# Patient Record
Sex: Male | Born: 1990 | Race: White | Hispanic: No | Marital: Single | Smoking: Never smoker
Health system: Southern US, Community
[De-identification: ages and names within clinical notes are randomized; demographics above are authoritative.]

## PROBLEM LIST (undated history)

## (undated) ENCOUNTER — Emergency Department (HOSPITAL_COMMUNITY): Admission: EM | Disposition: A | Discharge status: Home / Self Care | Payer: 59

---
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
## 2011-02-24 ENCOUNTER — Emergency Department (HOSPITAL_BASED_OUTPATIENT_CLINIC_OR_DEPARTMENT_OTHER)
Admission: EM | Admit: 2011-02-24 | Discharge: 2011-02-25 | Disposition: A | Discharge status: Home / Self Care | Payer: BC Managed Care – PPO | Attending: Emergency Medicine | Admitting: Emergency Medicine

## 2011-02-24 DIAGNOSIS — R110 Nausea: Secondary | ICD-10-CM | POA: Insufficient documentation

## 2011-02-24 DIAGNOSIS — R1031 Right lower quadrant pain: Secondary | ICD-10-CM | POA: Insufficient documentation

## 2011-02-25 ENCOUNTER — Emergency Department (INDEPENDENT_AMBULATORY_CARE_PROVIDER_SITE_OTHER): Payer: BC Managed Care – PPO

## 2011-02-25 DIAGNOSIS — R1031 Right lower quadrant pain: Secondary | ICD-10-CM

## 2011-02-25 LAB — CBC
HCT: 41.4 % (ref 39.0–52.0)
Hemoglobin: 14.4 g/dL (ref 13.0–17.0)
MCH: 28.9 pg (ref 26.0–34.0)
MCHC: 34.8 g/dL (ref 30.0–36.0)
MCV: 83 fL (ref 78.0–100.0)

## 2011-02-25 LAB — COMPREHENSIVE METABOLIC PANEL
CO2: 27 mEq/L (ref 19–32)
Calcium: 10.4 mg/dL (ref 8.4–10.5)
Creatinine, Ser: 0.8 mg/dL (ref 0.4–1.5)
GFR calc non Af Amer: >60 mL/min (ref >60)
Glucose, Bld: 109 mg/dL — ABNORMAL HIGH (ref 70–99)

## 2011-02-25 LAB — URINALYSIS, ROUTINE W REFLEX MICROSCOPIC
Ketones, ur: NEGATIVE mg/dL
Nitrite: NEGATIVE
Specific Gravity, Urine: 1.007 (ref 1.005–1.030)
pH: 7.5 (ref 5.0–8.0)

## 2011-02-25 LAB — DIFFERENTIAL
Basophils Relative: 0 % (ref 0–1)
Lymphocytes Relative: 23 % (ref 12–46)
Monocytes Absolute: 0.7 10*3/uL (ref 0.1–1.0)
Monocytes Relative: 8 % (ref 3–12)
Neutro Abs: 6.2 10*3/uL (ref 1.7–7.7)

## 2011-02-25 MED ORDER — IOHEXOL 300 MG/ML  SOLN
100.0000 mL | Freq: Once | INTRAMUSCULAR | Status: AC | PRN
  Administered 2011-02-25: 100 mL via INTRAVENOUS

## 2018-05-15 ENCOUNTER — Ambulatory Visit
Admission: RE | Admit: 2018-05-15 | Discharge: 2018-05-15 | Disposition: A | Discharge status: Home / Self Care | Payer: No Typology Code available for payment source | Source: Ambulatory Visit | Attending: Nurse Practitioner | Admitting: Nurse Practitioner

## 2018-05-15 ENCOUNTER — Other Ambulatory Visit: Payer: Self-pay | Admitting: Nurse Practitioner

## 2018-05-15 DIAGNOSIS — Z021 Encounter for pre-employment examination: Secondary | ICD-10-CM

## 2020-09-02 ENCOUNTER — Encounter (HOSPITAL_COMMUNITY): Payer: Self-pay | Admitting: Emergency Medicine

## 2020-09-02 ENCOUNTER — Emergency Department (HOSPITAL_COMMUNITY)
Admission: EM | Admit: 2020-09-02 | Discharge: 2020-09-02 | Disposition: A | Discharge status: Home / Self Care | Payer: No Typology Code available for payment source | Attending: Emergency Medicine | Admitting: Emergency Medicine

## 2020-09-02 ENCOUNTER — Other Ambulatory Visit: Payer: Self-pay

## 2020-09-02 DIAGNOSIS — Y990 Civilian activity done for income or pay: Secondary | ICD-10-CM | POA: Insufficient documentation

## 2020-09-02 DIAGNOSIS — Y92019 Unspecified place in single-family (private) house as the place of occurrence of the external cause: Secondary | ICD-10-CM | POA: Diagnosis not present

## 2020-09-02 DIAGNOSIS — T7840XA Allergy, unspecified, initial encounter: Secondary | ICD-10-CM | POA: Diagnosis not present

## 2020-09-02 MED ORDER — PREDNISONE 10 MG PO TABS: ORAL_TABLET | ORAL | 0 refills | Status: AC

## 2020-09-02 MED ORDER — FAMOTIDINE IN NACL 20-0.9 MG/50ML-% IV SOLN
20.0000 mg | Freq: Once | INTRAVENOUS | Status: AC
Start: 2020-09-02 — End: 2020-09-02
  Administered 2020-09-02: 20 mg via INTRAVENOUS
  Filled 2020-09-02: qty 50

## 2020-09-02 MED ORDER — EPINEPHRINE 0.3 MG/0.3ML IJ SOAJ: 0.3000 mg | INTRAMUSCULAR | 0 refills | Status: AC | PRN

## 2020-09-02 MED ORDER — DIPHENHYDRAMINE HCL 50 MG/ML IJ SOLN
25.0000 mg | Freq: Once | INTRAMUSCULAR | Status: AC
Start: 2020-09-02 — End: 2020-09-02
  Administered 2020-09-02: 25 mg via INTRAVENOUS
  Filled 2020-09-02: qty 1

## 2020-09-02 MED ORDER — METHYLPREDNISOLONE SODIUM SUCC 125 MG IJ SOLR
125.0000 mg | Freq: Once | INTRAMUSCULAR | Status: AC
Start: 2020-09-02 — End: 2020-09-02
  Administered 2020-09-02: 125 mg via INTRAVENOUS
  Filled 2020-09-02: qty 2

## 2020-09-02 MED ORDER — DIPHENHYDRAMINE HCL 50 MG/ML IJ SOLN
25.0000 mg | Freq: Once | INTRAMUSCULAR | Status: AC
  Administered 2020-09-02: 25 mg via INTRAVENOUS
  Filled 2020-09-02: qty 1
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
## 2020-09-02 NOTE — Discharge Instructions (Signed)
Thank you for allowing me to care for you today in the Emergency Department.   You were seen today for an allergic reaction.  You were treated with Solu-Medrol, Pepcid, and Benadryl.  I am discharging you with a course of prednisone.  Starting tomorrow, take as prescribed for the next 6 days.  I have also given you a prescription for an EpiPen.  An EpiPen should be used if you develop anaphylaxis, which includes difficulty breathing, feeling as if your throat is closing, if your tongue swells, or if you have swelling of your lips.  Additional information on anaphylaxis is attached along with your discharge paperwork.  Please follow-up with primary care since we are unsure what caused your allergic reaction today.  You can take Benadryl and Pepcid, both of which are available over-the-counter, if you continue to have hives.  Return to the emergency department if you develop difficulty breathing, feel as if your throat is closing, if your tongue or lip swells, if you have to use your EpiPen, or if you have other new, concerning symptoms.
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
## 2021-01-27 ENCOUNTER — Emergency Department (HOSPITAL_BASED_OUTPATIENT_CLINIC_OR_DEPARTMENT_OTHER): Payer: No Typology Code available for payment source

## 2021-01-27 ENCOUNTER — Other Ambulatory Visit: Payer: Self-pay

## 2021-01-27 ENCOUNTER — Emergency Department (HOSPITAL_BASED_OUTPATIENT_CLINIC_OR_DEPARTMENT_OTHER): Payer: No Typology Code available for payment source | Admitting: Radiology

## 2021-01-27 ENCOUNTER — Emergency Department (HOSPITAL_BASED_OUTPATIENT_CLINIC_OR_DEPARTMENT_OTHER)
Admission: EM | Admit: 2021-01-27 | Discharge: 2021-01-28 | Disposition: A | Discharge status: Home / Self Care | Payer: No Typology Code available for payment source | Attending: Emergency Medicine | Admitting: Emergency Medicine

## 2021-01-27 ENCOUNTER — Encounter (HOSPITAL_BASED_OUTPATIENT_CLINIC_OR_DEPARTMENT_OTHER): Payer: Self-pay

## 2021-01-27 DIAGNOSIS — S161XXA Strain of muscle, fascia and tendon at neck level, initial encounter: Secondary | ICD-10-CM | POA: Insufficient documentation

## 2021-01-27 DIAGNOSIS — R519 Headache, unspecified: Secondary | ICD-10-CM | POA: Diagnosis not present

## 2021-01-27 DIAGNOSIS — S5012XA Contusion of left forearm, initial encounter: Secondary | ICD-10-CM | POA: Diagnosis not present

## 2021-01-27 DIAGNOSIS — Y990 Civilian activity done for income or pay: Secondary | ICD-10-CM | POA: Diagnosis not present

## 2021-01-27 DIAGNOSIS — Y92410 Unspecified street and highway as the place of occurrence of the external cause: Secondary | ICD-10-CM | POA: Insufficient documentation

## 2021-01-27 DIAGNOSIS — S199XXA Unspecified injury of neck, initial encounter: Secondary | ICD-10-CM | POA: Diagnosis present

## 2021-01-27 MED ORDER — CYCLOBENZAPRINE HCL 10 MG PO TABS: 10.0000 mg | ORAL_TABLET | Freq: Two times a day (BID) | ORAL | 0 refills | Status: AC | PRN

## 2021-01-27 MED ORDER — NAPROXEN 500 MG PO TABS: 500.0000 mg | ORAL_TABLET | Freq: Two times a day (BID) | ORAL | 0 refills | Status: AC
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
## 2021-01-27 NOTE — Discharge Instructions (Addendum)
Take the medications as prescribed.  Return to the ED for abdominal pain vomiting or other concerning symptoms
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
## 2021-01-28 NOTE — ED Notes (Signed)
Charge RN said to wait until PT gets discharged for vitals.
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
## 2023-03-26 ENCOUNTER — Other Ambulatory Visit: Payer: Self-pay | Admitting: Nurse Practitioner

## 2023-03-26 ENCOUNTER — Ambulatory Visit
Admission: RE | Admit: 2023-03-26 | Discharge: 2023-03-26 | Disposition: A | Discharge status: Home / Self Care | Payer: No Typology Code available for payment source | Source: Ambulatory Visit | Attending: Nurse Practitioner | Admitting: Nurse Practitioner

## 2023-03-26 DIAGNOSIS — Z0000 Encounter for general adult medical examination without abnormal findings: Secondary | ICD-10-CM
